# Patient Record
Sex: Male | Born: 1940 | Race: White | Hispanic: No | Marital: Married | Smoking: Never smoker
Health system: Southern US, Community
[De-identification: ages and names within clinical notes are randomized; demographics above are authoritative.]

## PROBLEM LIST (undated history)

## (undated) DIAGNOSIS — I10 Essential (primary) hypertension: Secondary | ICD-10-CM

## (undated) DIAGNOSIS — E7800 Pure hypercholesterolemia, unspecified: Secondary | ICD-10-CM

---

## 2015-03-31 ENCOUNTER — Inpatient Hospital Stay (HOSPITAL_COMMUNITY)
Admission: EM | Admit: 2015-03-31 | Discharge: 2015-04-04 | DRG: 520 | Disposition: A | Discharge status: Home Health | Payer: Medicare Other | Attending: Neurological Surgery | Admitting: Neurological Surgery

## 2015-03-31 ENCOUNTER — Encounter (HOSPITAL_COMMUNITY): Payer: Self-pay | Admitting: Emergency Medicine

## 2015-03-31 DIAGNOSIS — E780 Pure hypercholesterolemia: Secondary | ICD-10-CM | POA: Diagnosis present

## 2015-03-31 DIAGNOSIS — Z7952 Long term (current) use of systemic steroids: Secondary | ICD-10-CM | POA: Diagnosis not present

## 2015-03-31 DIAGNOSIS — M549 Dorsalgia, unspecified: Secondary | ICD-10-CM | POA: Diagnosis present

## 2015-03-31 DIAGNOSIS — M5416 Radiculopathy, lumbar region: Secondary | ICD-10-CM | POA: Diagnosis present

## 2015-03-31 DIAGNOSIS — M5116 Intervertebral disc disorders with radiculopathy, lumbar region: Secondary | ICD-10-CM | POA: Diagnosis present

## 2015-03-31 DIAGNOSIS — M5126 Other intervertebral disc displacement, lumbar region: Secondary | ICD-10-CM

## 2015-03-31 DIAGNOSIS — I10 Essential (primary) hypertension: Secondary | ICD-10-CM | POA: Diagnosis present

## 2015-03-31 DIAGNOSIS — Z791 Long term (current) use of non-steroidal anti-inflammatories (NSAID): Secondary | ICD-10-CM | POA: Diagnosis not present

## 2015-03-31 DIAGNOSIS — Z419 Encounter for procedure for purposes other than remedying health state, unspecified: Secondary | ICD-10-CM

## 2015-03-31 HISTORY — DX: Pure hypercholesterolemia, unspecified: E78.00

## 2015-03-31 HISTORY — DX: Essential (primary) hypertension: I10

## 2015-03-31 MED ORDER — HYDROMORPHONE HCL 1 MG/ML IJ SOLN
1.0000 mg | INTRAMUSCULAR | Status: DC | PRN
  Administered 2015-04-03 – 2015-04-04 (×2): 1 mg via INTRAVENOUS
  Filled 2015-03-31 (×2): qty 1

## 2015-03-31 MED ORDER — FLUTICASONE PROPIONATE 50 MCG/ACT NA SUSP
1.0000 | Freq: Every day | NASAL | Status: DC
  Administered 2015-04-04: 1 via NASAL
  Filled 2015-03-31 (×2): qty 16

## 2015-03-31 MED ORDER — OXYCODONE-ACETAMINOPHEN 5-325 MG PO TABS
1.0000 | ORAL_TABLET | Freq: Once | ORAL | Status: AC
Start: 2015-03-31 — End: 2015-03-31
  Administered 2015-03-31: 1 via ORAL

## 2015-03-31 MED ORDER — HYDROCODONE-ACETAMINOPHEN 5-325 MG PO TABS: 1.0000 | ORAL_TABLET | Freq: Four times a day (QID) | ORAL | Status: DC | PRN

## 2015-03-31 MED ORDER — KETOROLAC TROMETHAMINE 15 MG/ML IJ SOLN
15.0000 mg | Freq: Four times a day (QID) | INTRAMUSCULAR | Status: AC
  Administered 2015-03-31 – 2015-04-01 (×5): 15 mg via INTRAVENOUS
  Filled 2015-03-31 (×5): qty 1

## 2015-03-31 MED ORDER — HYDROXYZINE HCL 25 MG PO TABS: 50.0000 mg | ORAL_TABLET | Freq: Three times a day (TID) | ORAL | Status: DC | PRN

## 2015-03-31 MED ORDER — DEXAMETHASONE SODIUM PHOSPHATE 10 MG/ML IJ SOLN
INTRAMUSCULAR | Status: AC
  Filled 2015-03-31: qty 1

## 2015-03-31 MED ORDER — MAGNESIUM CITRATE PO SOLN: 1.0000 | Freq: Once | ORAL | Status: AC | PRN

## 2015-03-31 MED ORDER — DIAZEPAM 5 MG PO TABS
10.0000 mg | ORAL_TABLET | Freq: Three times a day (TID) | ORAL | Status: DC | PRN
  Administered 2015-04-03 – 2015-04-04 (×2): 10 mg via ORAL
  Filled 2015-03-31 (×2): qty 2

## 2015-03-31 MED ORDER — METOCLOPRAMIDE HCL 10 MG PO TABS
10.0000 mg | ORAL_TABLET | Freq: Three times a day (TID) | ORAL | Status: DC
  Administered 2015-03-31 – 2015-04-04 (×8): 10 mg via ORAL
  Filled 2015-03-31 (×8): qty 1

## 2015-03-31 MED ORDER — PANTOPRAZOLE SODIUM 40 MG PO TBEC
40.0000 mg | DELAYED_RELEASE_TABLET | Freq: Every day | ORAL | Status: DC
  Administered 2015-04-01 – 2015-04-04 (×4): 40 mg via ORAL
  Filled 2015-03-31 (×4): qty 1

## 2015-03-31 MED ORDER — AMLODIPINE BESYLATE 5 MG PO TABS
5.0000 mg | ORAL_TABLET | Freq: Every day | ORAL | Status: DC
Start: 2015-04-01 — End: 2015-04-04
  Administered 2015-04-01 – 2015-04-04 (×4): 5 mg via ORAL
  Filled 2015-03-31 (×4): qty 1

## 2015-03-31 MED ORDER — HYDROCHLOROTHIAZIDE 25 MG PO TABS: 12.5000 mg | ORAL_TABLET | Freq: Every morning | ORAL | Status: DC

## 2015-03-31 MED ORDER — BISACODYL 10 MG RE SUPP: 10.0000 mg | Freq: Every day | RECTAL | Status: DC | PRN

## 2015-03-31 MED ORDER — ALUM & MAG HYDROXIDE-SIMETH 200-200-20 MG/5ML PO SUSP: 30.0000 mL | Freq: Four times a day (QID) | ORAL | Status: DC | PRN

## 2015-03-31 MED ORDER — OXYCODONE-ACETAMINOPHEN 5-325 MG PO TABS
1.0000 | ORAL_TABLET | ORAL | Status: DC | PRN
  Administered 2015-04-01 – 2015-04-03 (×9): 2 via ORAL
  Administered 2015-04-03: 1 via ORAL
  Filled 2015-03-31 (×11): qty 2

## 2015-03-31 MED ORDER — OXYCODONE-ACETAMINOPHEN 5-325 MG PO TABS
1.0000 | ORAL_TABLET | Freq: Once | ORAL | Status: AC
  Administered 2015-03-31: 1 via ORAL
  Filled 2015-03-31: qty 1

## 2015-03-31 MED ORDER — HYDROMORPHONE HCL 1 MG/ML IJ SOLN
1.0000 mg | INTRAMUSCULAR | Status: DC | PRN
  Administered 2015-03-31: 1 mg via INTRAVENOUS
  Filled 2015-03-31: qty 1

## 2015-03-31 MED ORDER — DOCUSATE SODIUM 100 MG PO CAPS
100.0000 mg | ORAL_CAPSULE | Freq: Two times a day (BID) | ORAL | Status: DC
  Administered 2015-03-31 – 2015-04-02 (×5): 100 mg via ORAL
  Filled 2015-03-31 (×5): qty 1

## 2015-03-31 MED ORDER — OXYCODONE-ACETAMINOPHEN 5-325 MG PO TABS
ORAL_TABLET | ORAL | Status: AC
  Administered 2015-03-31: 1 via ORAL
  Filled 2015-03-31: qty 1

## 2015-03-31 MED ORDER — ONDANSETRON HCL 4 MG/2ML IJ SOLN: 4.0000 mg | Freq: Three times a day (TID) | INTRAMUSCULAR | Status: AC | PRN

## 2015-03-31 MED ORDER — DEXAMETHASONE SODIUM PHOSPHATE 10 MG/ML IJ SOLN
10.0000 mg | Freq: Once | INTRAMUSCULAR | Status: AC
  Administered 2015-03-31: 10 mg via INTRAVENOUS

## 2015-03-31 MED ORDER — SENNA 8.6 MG PO TABS
1.0000 | ORAL_TABLET | Freq: Two times a day (BID) | ORAL | Status: DC
  Administered 2015-03-31 – 2015-04-02 (×5): 8.6 mg via ORAL
  Filled 2015-03-31 (×5): qty 1

## 2015-03-31 MED ORDER — HYDROCHLOROTHIAZIDE 12.5 MG PO CAPS
12.5000 mg | ORAL_CAPSULE | Freq: Every morning | ORAL | Status: DC
  Administered 2015-04-01 – 2015-04-04 (×4): 12.5 mg via ORAL
  Filled 2015-03-31 (×4): qty 1

## 2015-03-31 MED ORDER — POLYETHYLENE GLYCOL 3350 17 G PO PACK: 17.0000 g | PACK | Freq: Every day | ORAL | Status: DC | PRN

## 2015-03-31 MED ORDER — DOCUSATE SODIUM 100 MG PO CAPS: 100.0000 mg | ORAL_CAPSULE | Freq: Two times a day (BID) | ORAL | Status: DC

## 2015-03-31 MED ORDER — ENOXAPARIN SODIUM 30 MG/0.3ML ~~LOC~~ SOLN
30.0000 mg | SUBCUTANEOUS | Status: DC
  Administered 2015-04-01 – 2015-04-04 (×4): 30 mg via SUBCUTANEOUS
  Filled 2015-03-31 (×4): qty 0.3

## 2015-03-31 MED ORDER — DEXAMETHASONE SODIUM PHOSPHATE 10 MG/ML IJ SOLN
8.0000 mg | Freq: Four times a day (QID) | INTRAMUSCULAR | Status: DC
  Administered 2015-03-31 – 2015-04-02 (×6): 8 mg via INTRAVENOUS
  Filled 2015-03-31 (×6): qty 1

## 2015-03-31 MED ORDER — METOPROLOL SUCCINATE ER 25 MG PO TB24
50.0000 mg | ORAL_TABLET | Freq: Every morning | ORAL | Status: DC
  Administered 2015-04-02 – 2015-04-03 (×2): 50 mg via ORAL
  Filled 2015-03-31 (×2): qty 2

## 2015-03-31 NOTE — ED Provider Notes (Signed)
CSN: [id number]     Arrival date & time 03/31/15  1515 History   First MD Initiated Contact with Patient 03/31/15 1748     Chief Complaint  Patient presents with  . Back Pain  . Leg Pain     (Consider location/radiation/quality/duration/timing/severity/associated sxs/prior Treatment) HPI Complains of right-sided paralumbar pain onset one week ago radiating to his right thigh. Pain is worse with changing positions improved with remaining still. No other associated symptoms. Patient seen at [facility] last week. Had CT scan of the lumbar spine consistent with L4-L5 disc protrusion and degenerative disc disease. He denies loss of bladder or bowel control no fever no other associated symptoms. Treated with Medrol Dosepak, Percocet, Valium, without adequate pain relief. No other associated symptoms Past Medical History  Diagnosis Date  . Hypertension   . Hypercholesteremia    History reviewed. No pertinent past surgical history. History reviewed. No pertinent family history. History  Substance Use Topics  . Smoking status: Never Smoker   . Smokeless tobacco: Not on file  . Alcohol Use: No    Review of Systems  Constitutional: Negative.   HENT: Negative.   Respiratory: Negative.   Cardiovascular: Negative.   Gastrointestinal: Negative.   Musculoskeletal: Positive for back pain.  Skin: Negative.   Neurological: Negative.   Psychiatric/Behavioral: Negative.   All other systems reviewed and are negative.     Allergies  Crestor; Lipitor; Lopid; Niaspan; Other; Pravachol; Questran; Zetia; and Zocor  Home Medications   Prior to Admission medications   Medication Sig Start Date End Date Taking? Authorizing Provider  amLODipine (NORVASC) 5 MG tablet Take 5 mg by mouth daily. 01/14/15  Yes Historical Provider, MD  diazepam (VALIUM) 10 MG tablet Take 10 mg by mouth 2 (two) times daily as needed for anxiety.  03/27/15  Yes Historical Provider, MD  docusate sodium (COLACE) 100 MG  capsule Take 100 mg by mouth 2 (two) times daily.   Yes Historical Provider, MD  fenofibrate 160 MG tablet Take 160 mg by mouth daily. 01/14/15  Yes Historical Provider, MD  fluticasone (FLONASE) 50 MCG/ACT nasal spray Place 1-2 sprays into both nostrils daily. 02/04/15  Yes Historical Provider, MD  hydrochlorothiazide (HYDRODIURIL) 12.5 MG tablet Take 12.5 mg by mouth every morning. 02/04/15  Yes Historical Provider, MD  HYDROcodone-acetaminophen (NORCO/VICODIN) 5-325 MG per tablet Take 1 tablet by mouth every 6 (six) hours as needed. for pain 03/26/15  Yes Historical Provider, MD  hydroxychloroquine (PLAQUENIL) 200 MG tablet Take 200 mg by mouth 2 (two) times daily. 03/02/15  Yes Historical Provider, MD  hydrOXYzine (VISTARIL) 50 MG capsule Take 50 mg by mouth 3 (three) times daily as needed for itching.  03/27/15  Yes Historical Provider, MD  ibuprofen (ADVIL,MOTRIN) 600 MG tablet Take 600 mg by mouth every 6 (six) hours as needed for moderate pain.  03/26/15  Yes Historical Provider, MD  meloxicam (MOBIC) 7.5 MG tablet Take 7.5 mg by mouth daily. 03/20/15  Yes Historical Provider, MD  methylPREDNISolone (MEDROL DOSEPAK) 4 MG TBPK tablet Take 4 mg by mouth See admin instructions. 03/27/15  Yes Historical Provider, MD  metoCLOPramide (REGLAN) 10 MG tablet Take 10 mg by mouth 4 (four) times daily -  before meals and at bedtime. 03/27/15  Yes Historical Provider, MD  metoprolol succinate (TOPROL-XL) 50 MG 24 hr tablet Take 50 mg by mouth every morning. 02/09/15  Yes Historical Provider, MD  omeprazole (PRILOSEC) 20 MG capsule Take 20 mg by mouth 2 (two) times daily. 02/16/15  Yes Historical Provider, MD  oxyCODONE-acetaminophen (PERCOCET/ROXICET) 5-325 MG per tablet Take 1-2 tablets by mouth every 4 (four) hours as needed for moderate pain or severe pain.  03/27/15  Yes Historical Provider, MD  predniSONE (DELTASONE) 5 MG tablet Take 5 mg by mouth daily. 03/20/15  Yes Historical Provider, MD  tiZANidine (ZANAFLEX) 4  MG tablet Take 4 mg by mouth 3 (three) times daily. 03/24/15  Yes Historical Provider, MD  traMADol (ULTRAM) 50 MG tablet Take 50 mg by mouth every 4 (four) hours as needed for moderate pain.  02/09/15  Yes Historical Provider, MD  WELCHOL 625 MG tablet Take 1,875 mg by mouth 2 (two) times daily. 03/18/15  Yes Historical Provider, MD   BP 168/77 mmHg  Pulse 62  Temp(Src) 97.6 F (36.4 C) (Oral)  Resp 18  SpO2 96% Physical Exam  Constitutional: He appears well-developed and well-nourished. No distress.  HENT:  Head: Normocephalic and atraumatic.  Eyes: Conjunctivae are normal. Pupils are equal, round, and reactive to light.  Neck: Neck supple. No tracheal deviation present. No thyromegaly present.  Cardiovascular: Normal rate and regular rhythm.   No murmur heard. Pulmonary/Chest: Effort normal and breath sounds normal.  Abdominal: Soft. Bowel sounds are normal. He exhibits no distension. There is no tenderness.  Obese  Musculoskeletal: Normal range of motion. He exhibits no edema or tenderness.  Entire spine nontender. Has right-sided paralumbar pain when standing from a seated position. Gait normal DTR symmetric bilaterally knee jerk ankle jerk and biceps toes downward going bilaterally  Neurological: He is alert. Coordination normal.  Skin: Skin is warm and dry. No rash noted.  Psychiatric: He has a normal mood and affect.  Nursing note and vitals reviewed.   ED Course  Procedures (including critical care time) Labs Review Labs Reviewed - No data to display  Imaging Review No results found.   EKG Interpretation None      MDM  With Dr. Danielle Dess who reviewed CT scans and arrange for admission for pain control and surgery Final diagnoses:  None   diagnosis lumbar radiculopathy      Doug Sou, MD 03/31/15 1935

## 2015-03-31 NOTE — ED Notes (Signed)
Pt c/o right leg and lower back pain; pt seen in ED x 2 in last week for same and told has bulging disc but not getting relief with PO meds

## 2015-03-31 NOTE — H&P (Addendum)
Dean Andersen is an 74 y.o. male.   Chief Complaint: Severe right lower extremity pain for over a week's time HPI: Patient is a 74 year old individual who's had severe back and right lower extremity pain that started rather insidiously a little over week ago. He is seen in the [facility] emergency room where CT scan of his back was performed. He was subsequently sent home and given some pain medication. He presented to the [facility] emergency room in severe pain. I was asked to review the CAT scan but because the patient was not responding to high doses of narcotic analgesia was decided to admit the patient in preparation for surgical intervention which would include a discectomy at the level of L4-L5 to remove a large right-sided fragment of disc herniation the age and is compressing the L4 nerve root.  Past Medical History  Diagnosis Date  . Hypertension   . Hypercholesteremia     History reviewed. No pertinent past surgical history.  History reviewed. No pertinent family history. Social History:  reports that he has never smoked. He does not have any smokeless tobacco history on file. He reports that he does not drink alcohol or use illicit drugs.  Allergies:  Allergies  Allergen Reactions  . Crestor [Rosuvastatin Calcium] Other (See Comments)    Leg ache  . Lipitor [Atorvastatin] Other (See Comments)    myopathy  . Lopid [Gemfibrozil] Other (See Comments)    Muscle soreness  . Niaspan [Niacin Er] Other (See Comments)    flush  . Other Other (See Comments)    Angiotensin receptor blockers: increase creatinine   . Pravachol [Pravastatin Sodium] Other (See Comments)    unknown  . Questran [Cholestyramine] Other (See Comments)    indigestion  . Zetia [Ezetimibe] Other (See Comments)    Muscle pain  . Zocor [Simvastatin] Other (See Comments)    myopathy    Medications Prior to Admission  Medication Sig Dispense Refill  . amLODipine (NORVASC) 5 MG tablet Take 5 mg by  mouth daily.  0  . diazepam (VALIUM) 10 MG tablet Take 10 mg by mouth 2 (two) times daily as needed for anxiety.   0  . docusate sodium (COLACE) 100 MG capsule Take 100 mg by mouth 2 (two) times daily.    . fenofibrate 160 MG tablet Take 160 mg by mouth daily.  0  . fluticasone (FLONASE) 50 MCG/ACT nasal spray Place 1-2 sprays into both nostrils daily.  3  . hydrochlorothiazide (HYDRODIURIL) 12.5 MG tablet Take 12.5 mg by mouth every morning.  4  . HYDROcodone-acetaminophen (NORCO/VICODIN) 5-325 MG per tablet Take 1 tablet by mouth every 6 (six) hours as needed. for pain  0  . hydroxychloroquine (PLAQUENIL) 200 MG tablet Take 200 mg by mouth 2 (two) times daily.  3  . hydrOXYzine (VISTARIL) 50 MG capsule Take 50 mg by mouth 3 (three) times daily as needed for itching.   0  . ibuprofen (ADVIL,MOTRIN) 600 MG tablet Take 600 mg by mouth every 6 (six) hours as needed for moderate pain.   0  . meloxicam (MOBIC) 7.5 MG tablet Take 7.5 mg by mouth daily.  1  . methylPREDNISolone (MEDROL DOSEPAK) 4 MG TBPK tablet Take 4 mg by mouth See admin instructions.  0  . metoCLOPramide (REGLAN) 10 MG tablet Take 10 mg by mouth 4 (four) times daily -  before meals and at bedtime.  0  . metoprolol succinate (TOPROL-XL) 50 MG 24 hr tablet Take 50 mg by  mouth every morning.  3  . omeprazole (PRILOSEC) 20 MG capsule Take 20 mg by mouth 2 (two) times daily.  3  . oxyCODONE-acetaminophen (PERCOCET/ROXICET) 5-325 MG per tablet Take 1-2 tablets by mouth every 4 (four) hours as needed for moderate pain or severe pain.   0  . predniSONE (DELTASONE) 5 MG tablet Take 5 mg by mouth daily.  3  . tiZANidine (ZANAFLEX) 4 MG tablet Take 4 mg by mouth 3 (three) times daily.  99  . traMADol (ULTRAM) 50 MG tablet Take 50 mg by mouth every 4 (four) hours as needed for moderate pain.   0  . WELCHOL 625 MG tablet Take 1,875 mg by mouth 2 (two) times daily.  3    No results found for this or any previous visit (from the past 48  hour(s)). No results found.  Review of Systems  Constitutional: Positive for malaise/fatigue.  HENT: Negative.   Eyes: Negative.   Respiratory: Negative.   Cardiovascular:       Hypertension and high cholesterol allergic to most cholesterol-lowering ring agents also a lot of allergic to angiogram tense and receptor blockers as these have elevated his creatinine  Gastrointestinal: Negative.   Genitourinary: Negative.   Musculoskeletal: Positive for back pain.  Skin: Negative.   Neurological: Positive for weakness.       Right lower extremity pain and weakness in the iliopsoas and the quad  Endo/Heme/Allergies: Negative.   Psychiatric/Behavioral: Negative.     Blood pressure 181/67, pulse 56, temperature 97.4 F (36.3 C), temperature source Axillary, resp. rate 18, SpO2 96 %. Physical Exam  Constitutional: He appears well-developed and well-nourished.  HENT:  Head: Normocephalic and atraumatic.  Eyes: Conjunctivae and EOM are normal. Pupils are equal, round, and reactive to light.  Neck: Normal range of motion. Neck supple.  Cardiovascular: Normal rate and regular rhythm.   Respiratory: Effort normal and breath sounds normal.  GI: Soft. Bowel sounds are normal.  Musculoskeletal:  Weakness in the quadriceps on the right side to 4 minus out of 5 absent Achilles reflex bilaterally absent patellar reflex on the right 2+ patellar reflex on the left cranial nerve examination is normal motor strength in upper extremities is normal patient has severe antalgia on the right leg when he walks with a knee having a tendency to buckle.  Neurological:  Marked paravertebral spasm in the lower back  Skin: Skin is warm and dry.  Psychiatric: He has a normal mood and affect. His behavior is normal. Judgment and thought content normal.     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  Parenteral pain medication control high-dose Decadron.  Dean Andersen J 03/31/2015, 10:10 PM

## 2015-03-31 NOTE — Progress Notes (Signed)
Pt arrived to room 4N17 from ED. Pt is alert and oriented and stating pain 10/10.  Safety measures in place.  Wife is at bedside.  RN paged on-call neurosurgereon and is awaiting orders.  Will continue to monitor.  Estanislado Emms, RN

## 2015-04-01 ENCOUNTER — Other Ambulatory Visit: Payer: Self-pay | Admitting: Neurological Surgery

## 2015-04-01 LAB — COMPREHENSIVE METABOLIC PANEL
ALBUMIN: 3.8 g/dL (ref 3.5–5.0)
ALT: 33 U/L (ref 17–63)
AST: 29 U/L (ref 15–41)
Alkaline Phosphatase: 35 U/L — ABNORMAL LOW (ref 38–126)
Anion gap: 10 (ref 5–15)
BUN: 20 mg/dL (ref 6–20)
CALCIUM: 9.5 mg/dL (ref 8.9–10.3)
CO2: 27 mmol/L (ref 22–32)
CREATININE: 1.35 mg/dL — AB (ref 0.61–1.24)
Chloride: 102 mmol/L (ref 101–111)
GFR calc Af Amer: 59 mL/min — ABNORMAL LOW (ref >60)
GFR calc non Af Amer: 51 mL/min — ABNORMAL LOW (ref >60)
Glucose, Bld: 141 mg/dL — ABNORMAL HIGH (ref 65–99)
Potassium: 4.2 mmol/L (ref 3.5–5.1)
Sodium: 139 mmol/L (ref 135–145)
Total Bilirubin: 0.7 mg/dL (ref 0.3–1.2)
Total Protein: 6 g/dL — ABNORMAL LOW (ref 6.5–8.1)

## 2015-04-01 LAB — CBC
HEMATOCRIT: 38.6 % — AB (ref 39.0–52.0)
HEMATOCRIT: 40.7 % (ref 39.0–52.0)
HEMOGLOBIN: 13.8 g/dL (ref 13.0–17.0)
Hemoglobin: 13 g/dL (ref 13.0–17.0)
MCH: 30.3 pg (ref 26.0–34.0)
MCH: 30.8 pg (ref 26.0–34.0)
MCHC: 33.7 g/dL (ref 30.0–36.0)
MCHC: 33.9 g/dL (ref 30.0–36.0)
MCV: 89.3 fL (ref 78.0–100.0)
MCV: 91.5 fL (ref 78.0–100.0)
Platelets: 178 10*3/uL (ref 150–400)
Platelets: 215 10*3/uL (ref 150–400)
RBC: 4.22 MIL/uL (ref 4.22–5.81)
RBC: 4.56 MIL/uL (ref 4.22–5.81)
RDW: 12.7 % (ref 11.5–15.5)
RDW: 12.8 % (ref 11.5–15.5)
WBC: 8.5 10*3/uL (ref 4.0–10.5)
WBC: 8.8 10*3/uL (ref 4.0–10.5)

## 2015-04-01 LAB — CREATININE, SERUM
Creatinine, Ser: 1.37 mg/dL — ABNORMAL HIGH (ref 0.61–1.24)
GFR calc Af Amer: 58 mL/min — ABNORMAL LOW (ref >60)
GFR, EST NON AFRICAN AMERICAN: 50 mL/min — AB (ref >60)

## 2015-04-01 LAB — GLUCOSE, CAPILLARY
GLUCOSE-CAPILLARY: 171 mg/dL — AB (ref 65–99)
Glucose-Capillary: 170 mg/dL — ABNORMAL HIGH (ref 65–99)
Glucose-Capillary: 139 mg/dL — ABNORMAL HIGH (ref 65–99)

## 2015-04-02 ENCOUNTER — Inpatient Hospital Stay (HOSPITAL_COMMUNITY): Payer: Medicare Other

## 2015-04-02 LAB — GLUCOSE, CAPILLARY
GLUCOSE-CAPILLARY: 144 mg/dL — AB (ref 65–99)
Glucose-Capillary: 173 mg/dL — ABNORMAL HIGH (ref 65–99)
Glucose-Capillary: 157 mg/dL — ABNORMAL HIGH (ref 65–99)
Glucose-Capillary: 139 mg/dL — ABNORMAL HIGH (ref 65–99)

## 2015-04-02 MED ORDER — DEXAMETHASONE 4 MG PO TABS
4.0000 mg | ORAL_TABLET | Freq: Three times a day (TID) | ORAL | Status: DC
  Administered 2015-04-02 – 2015-04-03 (×5): 4 mg via ORAL
  Filled 2015-04-02 (×5): qty 1

## 2015-04-02 NOTE — Progress Notes (Signed)
Patient ID: Dean Andersen, male   DOB: 01-20-1941, 74 y.o.   MRN: [id number] Vital signs are stable Patient feels a bit better He is sitting up more comfortably Right leg pain still there Prior to proceeding with surgery tomorrow like to obtain a MRI to verify his pathology Will order that today

## 2015-04-03 ENCOUNTER — Encounter (HOSPITAL_COMMUNITY)
Admission: EM | Disposition: A | Discharge status: Home Health | Payer: Self-pay | Source: Home / Self Care | Attending: Neurological Surgery

## 2015-04-03 ENCOUNTER — Inpatient Hospital Stay (HOSPITAL_COMMUNITY): Payer: Medicare Other

## 2015-04-03 ENCOUNTER — Encounter (HOSPITAL_COMMUNITY): Payer: Self-pay | Admitting: Anesthesiology

## 2015-04-03 ENCOUNTER — Inpatient Hospital Stay (HOSPITAL_COMMUNITY): Payer: Medicare Other | Admitting: Anesthesiology

## 2015-04-03 HISTORY — PX: LUMBAR LAMINECTOMY/DECOMPRESSION MICRODISCECTOMY: SHX5026

## 2015-04-03 LAB — GLUCOSE, CAPILLARY
GLUCOSE-CAPILLARY: 122 mg/dL — AB (ref 65–99)
Glucose-Capillary: 129 mg/dL — ABNORMAL HIGH (ref 65–99)

## 2015-04-03 LAB — SURGICAL PCR SCREEN
MRSA, PCR: NEGATIVE
Staphylococcus aureus: NEGATIVE

## 2015-04-03 SURGERY — LUMBAR LAMINECTOMY/DECOMPRESSION MICRODISCECTOMY 1 LEVEL
Anesthesia: General | Site: Back | Laterality: Right

## 2015-04-03 MED ORDER — DEXAMETHASONE SODIUM PHOSPHATE 4 MG/ML IJ SOLN
INTRAMUSCULAR | Status: DC | PRN
  Administered 2015-04-03: 4 mg via INTRAVENOUS

## 2015-04-03 MED ORDER — SODIUM CHLORIDE 0.9 % IJ SOLN
INTRAMUSCULAR | Status: AC
  Filled 2015-04-03: qty 10

## 2015-04-03 MED ORDER — PROMETHAZINE HCL 25 MG/ML IJ SOLN: 6.2500 mg | INTRAMUSCULAR | Status: DC | PRN

## 2015-04-03 MED ORDER — OXYCODONE-ACETAMINOPHEN 5-325 MG PO TABS
1.0000 | ORAL_TABLET | ORAL | Status: DC | PRN
  Administered 2015-04-03 – 2015-04-04 (×5): 2 via ORAL
  Filled 2015-04-03 (×5): qty 2

## 2015-04-03 MED ORDER — HYDROMORPHONE HCL 1 MG/ML IJ SOLN: 0.2500 mg | INTRAMUSCULAR | Status: DC | PRN

## 2015-04-03 MED ORDER — HYDROCODONE-ACETAMINOPHEN 5-325 MG PO TABS: 1.0000 | ORAL_TABLET | ORAL | Status: DC | PRN

## 2015-04-03 MED ORDER — POLYETHYLENE GLYCOL 3350 17 G PO PACK: 17.0000 g | PACK | Freq: Every day | ORAL | Status: DC | PRN

## 2015-04-03 MED ORDER — PHENYLEPHRINE 40 MCG/ML (10ML) SYRINGE FOR IV PUSH (FOR BLOOD PRESSURE SUPPORT)
PREFILLED_SYRINGE | INTRAVENOUS | Status: AC
  Filled 2015-04-03: qty 10

## 2015-04-03 MED ORDER — ONDANSETRON HCL 4 MG/2ML IJ SOLN
INTRAMUSCULAR | Status: DC | PRN
Start: 2015-04-03 — End: 2015-04-03
  Administered 2015-04-03: 4 mg via INTRAVENOUS

## 2015-04-03 MED ORDER — LIDOCAINE-EPINEPHRINE 1 %-1:100000 IJ SOLN
INTRAMUSCULAR | Status: DC | PRN
  Administered 2015-04-03: 4.5 mL

## 2015-04-03 MED ORDER — CEFAZOLIN SODIUM-DEXTROSE 2-3 GM-% IV SOLR
INTRAVENOUS | Status: AC
  Filled 2015-04-03: qty 50

## 2015-04-03 MED ORDER — LIDOCAINE HCL (CARDIAC) 20 MG/ML IV SOLN
INTRAVENOUS | Status: AC
  Filled 2015-04-03: qty 5

## 2015-04-03 MED ORDER — HYDROMORPHONE HCL 1 MG/ML IJ SOLN: 0.5000 mg | INTRAMUSCULAR | Status: DC | PRN

## 2015-04-03 MED ORDER — LIDOCAINE HCL (CARDIAC) 20 MG/ML IV SOLN
INTRAVENOUS | Status: DC | PRN
  Administered 2015-04-03: 60 mg via INTRAVENOUS

## 2015-04-03 MED ORDER — SENNA 8.6 MG PO TABS
1.0000 | ORAL_TABLET | Freq: Two times a day (BID) | ORAL | Status: DC
  Administered 2015-04-03 – 2015-04-04 (×2): 8.6 mg via ORAL
  Filled 2015-04-03 (×2): qty 1

## 2015-04-03 MED ORDER — SODIUM CHLORIDE 0.9 % IV SOLN: 250.0000 mL | INTRAVENOUS | Status: DC

## 2015-04-03 MED ORDER — ALUM & MAG HYDROXIDE-SIMETH 200-200-20 MG/5ML PO SUSP: 30.0000 mL | Freq: Four times a day (QID) | ORAL | Status: DC | PRN

## 2015-04-03 MED ORDER — 0.9 % SODIUM CHLORIDE (POUR BTL) OPTIME
TOPICAL | Status: DC | PRN
  Administered 2015-04-03: 1000 mL

## 2015-04-03 MED ORDER — FENTANYL CITRATE (PF) 100 MCG/2ML IJ SOLN
INTRAMUSCULAR | Status: DC | PRN
  Administered 2015-04-03 (×5): 50 ug via INTRAVENOUS

## 2015-04-03 MED ORDER — FLEET ENEMA 7-19 GM/118ML RE ENEM: 1.0000 | ENEMA | Freq: Once | RECTAL | Status: AC | PRN

## 2015-04-03 MED ORDER — SODIUM CHLORIDE 0.9 % IJ SOLN: 3.0000 mL | Freq: Two times a day (BID) | INTRAMUSCULAR | Status: DC

## 2015-04-03 MED ORDER — BISACODYL 10 MG RE SUPP: 10.0000 mg | Freq: Every day | RECTAL | Status: DC | PRN

## 2015-04-03 MED ORDER — LACTATED RINGERS IV SOLN
INTRAVENOUS | Status: DC | PRN
  Administered 2015-04-03: 15:00:00 via INTRAVENOUS

## 2015-04-03 MED ORDER — SODIUM CHLORIDE 0.9 % IJ SOLN: 3.0000 mL | INTRAMUSCULAR | Status: DC | PRN

## 2015-04-03 MED ORDER — PROPOFOL 10 MG/ML IV BOLUS
INTRAVENOUS | Status: AC
  Filled 2015-04-03: qty 20

## 2015-04-03 MED ORDER — PROPOFOL 10 MG/ML IV BOLUS
INTRAVENOUS | Status: DC | PRN
  Administered 2015-04-03: 150 mg via INTRAVENOUS

## 2015-04-03 MED ORDER — METHOCARBAMOL 500 MG PO TABS: 500.0000 mg | ORAL_TABLET | Freq: Four times a day (QID) | ORAL | Status: DC | PRN

## 2015-04-03 MED ORDER — THROMBIN 5000 UNITS EX SOLR
CUTANEOUS | Status: DC | PRN
  Administered 2015-04-03 (×2): 5000 [IU] via TOPICAL

## 2015-04-03 MED ORDER — METHOCARBAMOL 1000 MG/10ML IJ SOLN
500.0000 mg | Freq: Four times a day (QID) | INTRAVENOUS | Status: DC | PRN
  Filled 2015-04-03: qty 5

## 2015-04-03 MED ORDER — SODIUM CHLORIDE 0.9 % IR SOLN
Status: DC | PRN
  Administered 2015-04-03: 15:00:00

## 2015-04-03 MED ORDER — BUPIVACAINE HCL (PF) 0.5 % IJ SOLN
INTRAMUSCULAR | Status: DC | PRN
  Administered 2015-04-03: 4.5 mL
  Administered 2015-04-03: 20 mL

## 2015-04-03 MED ORDER — ARTIFICIAL TEARS OP OINT
TOPICAL_OINTMENT | OPHTHALMIC | Status: DC | PRN
  Administered 2015-04-03: 1 via OPHTHALMIC

## 2015-04-03 MED ORDER — DEXAMETHASONE 2 MG PO TABS
2.0000 mg | ORAL_TABLET | Freq: Two times a day (BID) | ORAL | Status: DC
  Administered 2015-04-03 – 2015-04-04 (×2): 2 mg via ORAL
  Filled 2015-04-03 (×2): qty 1

## 2015-04-03 MED ORDER — FENTANYL CITRATE (PF) 250 MCG/5ML IJ SOLN
INTRAMUSCULAR | Status: AC
  Filled 2015-04-03: qty 5

## 2015-04-03 MED ORDER — PHENYLEPHRINE HCL 10 MG/ML IJ SOLN
INTRAMUSCULAR | Status: DC | PRN
  Administered 2015-04-03 (×3): 80 ug via INTRAVENOUS

## 2015-04-03 MED ORDER — ONDANSETRON HCL 4 MG/2ML IJ SOLN
INTRAMUSCULAR | Status: AC
  Filled 2015-04-03: qty 2

## 2015-04-03 MED ORDER — DEXAMETHASONE SODIUM PHOSPHATE 4 MG/ML IJ SOLN
INTRAMUSCULAR | Status: AC
  Filled 2015-04-03: qty 1

## 2015-04-03 MED ORDER — HYDROCODONE-ACETAMINOPHEN 5-325 MG PO TABS
1.0000 | ORAL_TABLET | ORAL | Status: DC | PRN
  Filled 2015-04-03: qty 1

## 2015-04-03 MED ORDER — ACETAMINOPHEN 325 MG PO TABS: 650.0000 mg | ORAL_TABLET | ORAL | Status: DC | PRN

## 2015-04-03 MED ORDER — HEMOSTATIC AGENTS (NO CHARGE) OPTIME
TOPICAL | Status: DC | PRN
  Administered 2015-04-03: 1 via TOPICAL

## 2015-04-03 MED ORDER — MENTHOL 3 MG MT LOZG: 1.0000 | LOZENGE | OROMUCOSAL | Status: DC | PRN

## 2015-04-03 MED ORDER — SUCCINYLCHOLINE CHLORIDE 20 MG/ML IJ SOLN
INTRAMUSCULAR | Status: AC
  Filled 2015-04-03: qty 1

## 2015-04-03 MED ORDER — ROCURONIUM BROMIDE 100 MG/10ML IV SOLN
INTRAVENOUS | Status: DC | PRN
  Administered 2015-04-03: 40 mg via INTRAVENOUS

## 2015-04-03 MED ORDER — EPHEDRINE SULFATE 50 MG/ML IJ SOLN
INTRAMUSCULAR | Status: AC
  Filled 2015-04-03: qty 1

## 2015-04-03 MED ORDER — ONDANSETRON HCL 4 MG/2ML IJ SOLN: 4.0000 mg | INTRAMUSCULAR | Status: DC | PRN

## 2015-04-03 MED ORDER — GLYCOPYRROLATE 0.2 MG/ML IJ SOLN
INTRAMUSCULAR | Status: DC | PRN
  Administered 2015-04-03: 0.6 mg via INTRAVENOUS

## 2015-04-03 MED ORDER — PHENOL 1.4 % MT LIQD: 1.0000 | OROMUCOSAL | Status: DC | PRN

## 2015-04-03 MED ORDER — KETOROLAC TROMETHAMINE 15 MG/ML IJ SOLN
15.0000 mg | Freq: Four times a day (QID) | INTRAMUSCULAR | Status: DC
  Administered 2015-04-04 (×3): 15 mg via INTRAVENOUS
  Filled 2015-04-03 (×3): qty 1

## 2015-04-03 MED ORDER — ACETAMINOPHEN 650 MG RE SUPP
650.0000 mg | RECTAL | Status: DC | PRN
Start: 2015-04-03 — End: 2015-04-04

## 2015-04-03 MED ORDER — DOCUSATE SODIUM 100 MG PO CAPS
100.0000 mg | ORAL_CAPSULE | Freq: Two times a day (BID) | ORAL | Status: DC
  Administered 2015-04-03 – 2015-04-04 (×2): 100 mg via ORAL
  Filled 2015-04-03 (×2): qty 1

## 2015-04-03 MED ORDER — CEFAZOLIN SODIUM-DEXTROSE 2-3 GM-% IV SOLR
INTRAVENOUS | Status: DC | PRN
  Administered 2015-04-03: 2 g via INTRAVENOUS

## 2015-04-03 MED ORDER — NEOSTIGMINE METHYLSULFATE 10 MG/10ML IV SOLN
INTRAVENOUS | Status: DC | PRN
  Administered 2015-04-03: 4 mg via INTRAVENOUS

## 2015-04-03 SURGICAL SUPPLY — 51 items
BAG DECANTER FOR FLEXI CONT (MISCELLANEOUS) ×3 IMPLANT
BLADE CLIPPER SURG (BLADE) IMPLANT
BUR ACORN 6.0 (BURR) IMPLANT
BUR ACORN 6.0MM (BURR)
BUR MATCHSTICK NEURO 3.0 LAGG (BURR) ×3 IMPLANT
CANISTER SUCT 3000ML PPV (MISCELLANEOUS) ×3 IMPLANT
CONT SPEC 4OZ CLIKSEAL STRL BL (MISCELLANEOUS) ×3 IMPLANT
DECANTER SPIKE VIAL GLASS SM (MISCELLANEOUS) ×3 IMPLANT
DERMABOND ADHESIVE PROPEN (GAUZE/BANDAGES/DRESSINGS) ×2
DERMABOND ADVANCED (GAUZE/BANDAGES/DRESSINGS) ×2
DERMABOND ADVANCED .7 DNX12 (GAUZE/BANDAGES/DRESSINGS) ×1 IMPLANT
DERMABOND ADVANCED .7 DNX6 (GAUZE/BANDAGES/DRESSINGS) ×1 IMPLANT
DRAPE LAPAROTOMY T 102X78X121 (DRAPES) ×3 IMPLANT
DRAPE MICROSCOPE LEICA (MISCELLANEOUS) IMPLANT
DRAPE POUCH INSTRU U-SHP 10X18 (DRAPES) ×3 IMPLANT
DRAPE PROXIMA HALF (DRAPES) IMPLANT
DURAPREP 26ML APPLICATOR (WOUND CARE) ×3 IMPLANT
ELECT REM PT RETURN 9FT ADLT (ELECTROSURGICAL) ×3
ELECTRODE REM PT RTRN 9FT ADLT (ELECTROSURGICAL) ×1 IMPLANT
GAUZE SPONGE 4X4 12PLY STRL (GAUZE/BANDAGES/DRESSINGS) ×3 IMPLANT
GAUZE SPONGE 4X4 16PLY XRAY LF (GAUZE/BANDAGES/DRESSINGS) IMPLANT
GLOVE BIOGEL PI IND STRL 8.5 (GLOVE) ×1 IMPLANT
GLOVE BIOGEL PI INDICATOR 8.5 (GLOVE) ×2
GLOVE ECLIPSE 8.5 STRL (GLOVE) ×3 IMPLANT
GLOVE EXAM NITRILE LRG STRL (GLOVE) IMPLANT
GLOVE EXAM NITRILE MD LF STRL (GLOVE) IMPLANT
GLOVE EXAM NITRILE XL STR (GLOVE) IMPLANT
GLOVE EXAM NITRILE XS STR PU (GLOVE) IMPLANT
GOWN STRL REUS W/ TWL LRG LVL3 (GOWN DISPOSABLE) ×2 IMPLANT
GOWN STRL REUS W/ TWL XL LVL3 (GOWN DISPOSABLE) IMPLANT
GOWN STRL REUS W/TWL 2XL LVL3 (GOWN DISPOSABLE) ×3 IMPLANT
GOWN STRL REUS W/TWL LRG LVL3 (GOWN DISPOSABLE) ×4
GOWN STRL REUS W/TWL XL LVL3 (GOWN DISPOSABLE)
KIT BASIN OR (CUSTOM PROCEDURE TRAY) ×3 IMPLANT
KIT ROOM TURNOVER OR (KITS) ×3 IMPLANT
NEEDLE HYPO 22GX1.5 SAFETY (NEEDLE) ×3 IMPLANT
NEEDLE SPNL 20GX3.5 QUINCKE YW (NEEDLE) IMPLANT
NS IRRIG 1000ML POUR BTL (IV SOLUTION) ×3 IMPLANT
PACK LAMINECTOMY NEURO (CUSTOM PROCEDURE TRAY) ×3 IMPLANT
PAD ARMBOARD 7.5X6 YLW CONV (MISCELLANEOUS) ×9 IMPLANT
PATTIES SURGICAL .5 X1 (DISPOSABLE) ×3 IMPLANT
RUBBERBAND STERILE (MISCELLANEOUS) IMPLANT
SPONGE SURGIFOAM ABS GEL SZ50 (HEMOSTASIS) ×3 IMPLANT
SUT VIC AB 1 CT1 18XBRD ANBCTR (SUTURE) ×1 IMPLANT
SUT VIC AB 1 CT1 8-18 (SUTURE) ×2
SUT VIC AB 2-0 CP2 18 (SUTURE) ×3 IMPLANT
SUT VIC AB 3-0 SH 8-18 (SUTURE) ×3 IMPLANT
SYR 20ML ECCENTRIC (SYRINGE) ×3 IMPLANT
TOWEL OR 17X24 6PK STRL BLUE (TOWEL DISPOSABLE) ×3 IMPLANT
TOWEL OR 17X26 10 PK STRL BLUE (TOWEL DISPOSABLE) ×3 IMPLANT
WATER STERILE IRR 1000ML POUR (IV SOLUTION) ×3 IMPLANT

## 2015-04-03 NOTE — Op Note (Signed)
Date of surgery: 04/03/2015 Preoperative diagnosis: Herniated nucleus pulposus L4-5 right with right lumbar radiculopathy Postoperative diagnosis: Herniated nucleus pulposus L4-5 right with right lumbar radiculopathy Procedure: Microdiscectomy L4-5 right with operating microscope my dissection technique Surgeon: Barnett Abu First assistant: Maeola Harman M.D. Anesthesia: Gen. endotracheal Indications: The patient is a 74 year old individual who's had significant back and lower extremity pain he has evidence of a large extruded fragment of disc at L4-L5 on right side. Large fragment of the disc is behind the vertebral body of L4. Been advised regarding surgical decompression of this process. He was admitted to the hospital 2 days ago because of poor ability to control his pain. He has moderate weakness in his tibialis anterior group on the right side. Quadriceps strength is also weak on the right compared to the left.  Procedure: The patient was brought to the operating room supine on a stretcher. After the smooth induction of general endotracheal anesthesia, he was turned prone area the back was prepped with alcohol DuraPrep and draped in a sterile fashion. A midline incision was created in the lower lumbar spine and carried down to the lumbar dorsal fascia. 2 localizing radiographs identified the L4-L5 interspace positively. Subperiosteal dissection was carried out to expose the interspace at L4-L5. Then on the superior margin of the lamina of L4 on the right side a laminotomy was created. The common dural tube was identified. The dissection was carried out laterally. The lateral aspect of the dura was then carefully dissected under the operating microscope using high-power magnification. As the ventral aspect was exposed was noted to be significant disc material loose in the space. This was removed in a piecemeal fashion. Numerous fragments were removed from this region until the space was cleared. The  fragments range in size from just 3-4 mm to as large as 15 mm in diameter. In the end the good decompression of the common dural tube and the L4 nerve root was obtained. Hemostasis was obtained with bipolar cautery and some small pledgets of Gelfoam soaked in thrombin some of which were left in the interspace. The disc space itself could not be open as a discectomy itself was carried out mostly superior to the disc space. The path beyond the disc space appeared to be well decompressed. This was judged by passing a large ball tip nerve hook into this region. With this the microscope was removed and hemostasis and the soft tissues obtained meticulously and the lumbar dorsal fascia was then closed with #1 and 20 Vicryls in interrupted fashion. 20 mL of half percent Marcaine was injected into the paraspinous fascia. The subcutaneous tissue was closed with 20 Vicryls and 30 Vicryls used to close the subcutaneous take her skin. Dermabond was placed on the skin. Blood loss was estimated at about 50 mL.

## 2015-04-03 NOTE — Anesthesia Preprocedure Evaluation (Addendum)
Anesthesia Evaluation  Patient identified by MRN, date of birth, ID band Patient awake    Reviewed: Allergy & Precautions, NPO status , Patient's Chart, lab work & pertinent test results  History of Anesthesia Complications Negative for: history of anesthetic complications  Airway Mallampati: II  TM Distance: >3 FB Neck ROM: Full    Dental no notable dental hx. (+) Teeth Intact   Pulmonary neg pulmonary ROS,  breath sounds clear to auscultation  Pulmonary exam normal       Cardiovascular hypertension, Normal cardiovascular examRhythm:Regular Rate:Normal     Neuro/Psych  Neuromuscular disease    GI/Hepatic negative GI ROS, Neg liver ROS,   Endo/Other  negative endocrine ROS  Renal/GU negative Renal ROS     Musculoskeletal Lumber disc   Abdominal (+) + obese,   Peds  Hematology   Anesthesia Other Findings   Reproductive/Obstetrics                            Anesthesia Physical Anesthesia Plan  ASA: II  Anesthesia Plan: General   Post-op Pain Management:    Induction: Intravenous  Airway Management Planned: Oral ETT  Additional Equipment:   Intra-op Plan:   Post-operative Plan: Extubation in OR  Informed Consent: I have reviewed the patients History and Physical, chart, labs and discussed the procedure including the risks, benefits and alternatives for the proposed anesthesia with the patient or authorized representative who has indicated his/her understanding and acceptance.   Dental advisory given  Plan Discussed with: CRNA and Surgeon  Anesthesia Plan Comments:         Anesthesia Quick Evaluation

## 2015-04-03 NOTE — Progress Notes (Signed)
Dr. Okey Dupre notified of BP still up in the 190's, did not take his BP meds this am. OK to take his BP meds. Once back in his room. Patient denies any pain nor discomfort as of this time.

## 2015-04-03 NOTE — Anesthesia Procedure Notes (Signed)
Procedure Name: Intubation Date/Time: 04/03/2015 2:58 PM Performed by: Leonel Ramsay Pre-anesthesia Checklist: Patient identified, Timeout performed, Emergency Drugs available, Suction available and Patient being monitored Patient Re-evaluated:Patient Re-evaluated prior to inductionOxygen Delivery Method: Circle system utilized Preoxygenation: Pre-oxygenation with 100% oxygen Intubation Type: IV induction Ventilation: Mask ventilation without difficulty and Oral airway inserted - appropriate to patient size Laryngoscope Size: Mac and 4 Grade View: Grade I Tube type: Oral Tube size: 7.5 mm Number of attempts: 1 Airway Equipment and Method: Stylet and Oral airway Placement Confirmation: ETT inserted through vocal cords under direct vision,  positive ETCO2 and breath sounds checked- equal and bilateral Secured at: 22 cm Tube secured with: Tape Dental Injury: Teeth and Oropharynx as per pre-operative assessment

## 2015-04-03 NOTE — Transfer of Care (Signed)
Immediate Anesthesia Transfer of Care Note  Patient: Dean Andersen  Procedure(s) Performed: Procedure(s) with comments: Microdiscectomy at the level of Lumbar four- five (Right) - Right L4-5 Diskectomy  Patient Location: PACU  Anesthesia Type:General  Level of Consciousness: awake, alert  and oriented  Airway & Oxygen Therapy: Patient Spontanous Breathing and Patient connected to nasal cannula oxygen  Post-op Assessment: Report given to RN and Post -op Vital signs reviewed and stable  Post vital signs: Reviewed and stable  Last Vitals:  Filed Vitals:   04/03/15 1613  BP: 194/90  Pulse: 65  Temp: 36.8 C  Resp: 12    Complications: No apparent anesthesia complications

## 2015-04-04 LAB — GLUCOSE, CAPILLARY: GLUCOSE-CAPILLARY: 120 mg/dL — AB (ref 65–99)

## 2015-04-04 MED ORDER — OXYCODONE-ACETAMINOPHEN 5-325 MG PO TABS: 1.0000 | ORAL_TABLET | ORAL | Status: AC | PRN

## 2015-04-04 MED ORDER — METHYLPREDNISOLONE 4 MG PO TBPK: ORAL_TABLET | ORAL | Status: AC

## 2015-04-04 NOTE — Evaluation (Signed)
Occupational Therapy Evaluation Patient Details Name: Dean Andersen MRN: [id number] DOB: 01-03-1941 Today's Date: 04/04/2015    History of Present Illness 74 y.o. male s/p Microdiscectomy L4-5 right.   Clinical Impression   Pt is at mod A level with LB ADLs and sup with ADL mobility. Pt will have 24 hours assist at home and plans to d/c today. Pt and his wife provided with education and demo of ADL A/E, tub bench and 3 in 1 for hoe use. All education completed and no further acute OT indicated at this time    Follow Up Recommendations  No OT follow up;Supervision/Assistance - 24 hour    Equipment Recommendations  Tub/shower bench;3 in 1 bedside comode;Other (comment) (ADL A/E)    Recommendations for Other Services       Precautions / Restrictions Precautions Precautions: Back Precaution Booklet Issued: Yes (comment) Precaution Comments: pt able to recall 1/3 back precautions, reviewd with pt and wife Restrictions Weight Bearing Restrictions: No      Mobility Bed Mobility Overal bed mobility: Needs Assistance Bed Mobility: Rolling;Sidelying to Sit;Sit to Sidelying Rolling: Supervision Sidelying to sit: Supervision     Sit to sidelying: Supervision General bed mobility comments: pt up in recliner  Transfers Overall transfer level: Needs assistance Equipment used: Rolling walker (2 wheeled) Transfers: Sit to/from Stand Sit to Stand: Supervision         General transfer comment: Supervision for safety. VC for hand placement. Performed from various surfaces. Steady upon rising and holding RW for support.    Balance Overall balance assessment: Needs assistance Sitting-balance support: No upper extremity supported;Feet supported Sitting balance-Leahy Scale: Good     Standing balance support: Single extremity supported;During functional activity Standing balance-Leahy Scale: Poor                              ADL Overall ADL's : Needs  assistance/impaired     Grooming: Wash/dry hands;Wash/dry face;Standing;Min guard   Upper Body Bathing: Supervision/ safety;Set up;Sitting   Lower Body Bathing: Moderate assistance   Upper Body Dressing : Set up;Supervision/safety;Sitting   Lower Body Dressing: Moderate assistance   Toilet Transfer: RW;Grab bars;Comfort height toilet;Supervision/safety;Cueing for safety   Toileting- Clothing Manipulation and Hygiene: Minimal assistance;Sit to/from Nurse, children's Details (indicate cue type and reason): pt has tub shower at home and states that he will take sponge baths for awhile Functional mobility during ADLs: Cueing for safety;Supervision/safety General ADL Comments: pt and his wife eduacted on ADL A/E, 3 in 1 and tub bench for home use     Vision  no change from baseline   Perception Perception Perception Tested?: No   Praxis Praxis Praxis tested?: Not tested    Pertinent Vitals/Pain Pain Assessment: 0-10 Pain Score: 6  Pain Location: back Pain Descriptors / Indicators: Aching;Dull Pain Intervention(s): Monitored during session;Repositioned     Hand Dominance Right   Extremity/Trunk Assessment Upper Extremity Assessment Upper Extremity Assessment: Overall WFL for tasks assessed   Lower Extremity Assessment Lower Extremity Assessment: Defer to PT evaluation   Cervical / Trunk Assessment Cervical / Trunk Assessment: Normal   Communication Communication Communication: HOH   Cognition Arousal/Alertness: Awake/alert Behavior During Therapy: WFL for tasks assessed/performed Overall Cognitive Status: Within Functional Limits for tasks assessed                     General Comments   pt/family pleasant and cooperative  Home Living Family/patient expects to be discharged to:: Private residence Living Arrangements: Spouse/significant other Available Help at Discharge: Family;Available 24 hours/day Type of Home:  House Home Access: Stairs to enter [employer] of Steps: 1 Entrance Stairs-Rails: None Home Layout: Two level;Able to live on main level with bedroom/bathroom     Bathroom Shower/Tub: Chief Strategy Officer: Standard     Home Equipment: Environmental consultant - 2 wheels;Cane - quad          Prior Functioning/Environment Level of Independence: Independent with assistive device(s)        Comments: started using rollator about 1 week ago due to pain. Independent prior to this.    OT Diagnosis: Acute pain   OT Problem List: Decreased knowledge of use of DME or AE;Impaired balance (sitting and/or standing);Pain;Decreased activity tolerance   OT Treatment/Interventions:      OT Goals(Current goals can be found in the care plan section) Acute Rehab OT Goals Patient Stated Goal: Get well again OT Goal Formulation: With patient/family Time For Goal Achievement: 04/04/15 Potential to Achieve Goals: Good  OT Frequency:     Barriers to D/C:  none                        End of Session Equipment Utilized During Treatment: Rolling walker;Other (comment) (3 in 1)  Activity Tolerance: Patient tolerated treatment well Patient left: in chair;with family/visitor present   Time: 1025-1046 OT Time Calculation (min): 21 min Charges:  OT General Charges $OT Visit: 1 Procedure OT Evaluation $Initial OT Evaluation Tier I: 1 Procedure OT Treatments $Therapeutic Activity: 8-22 mins G-Codes:    Galen Manila 04/04/2015, 11:46 AM

## 2015-04-04 NOTE — Discharge Summary (Signed)
Physician Discharge Summary  Patient ID: Dean Andersen MRN: [id number] DOB/AGE: [birth year] 74 y.o.  Admit date: 03/31/2015 Discharge date: 04/04/2015  Admission Diagnoses: HNP, right L4-5  Discharge Diagnoses: Same Active Problems:   Lumbar radiculopathy   Herniated nucleus pulposus, L4-5 right   Discharged Condition: Stable  Hospital Course:  Dean Andersen is a 74 y.o. male admitted in transfer with severe back and right leg pain. He was initially placed on pain medication and steroids, and subsequently underwent microdiscectomy. Postop he was reporting some improvement in his leg pain, ambulating well with PT with a rolling walker, was tolerating diet and voiding normally.  Treatments: Surgery - Right L4-5 microdiscectomy  Discharge Exam: Blood pressure 129/52, pulse 56, temperature 97.9 F (36.6 C), temperature source Oral, resp. rate 16, height 5\' 9"  (1.753 m), weight 111 kg (244 lb 11.4 oz), SpO2 96 %. Awake, alert, oriented Speech fluent, appropriate CN grossly intact 5/5 BUE/BLE Wound c/d/i  Disposition: Home     Medication List    STOP taking these medications        predniSONE 5 MG tablet  Commonly known as:  DELTASONE      TAKE these medications        amLODipine 5 MG tablet  Commonly known as:  NORVASC  Take 5 mg by mouth daily.     diazepam 10 MG tablet  Commonly known as:  VALIUM  Take 10 mg by mouth 2 (two) times daily as needed for anxiety.     docusate sodium 100 MG capsule  Commonly known as:  COLACE  Take 100 mg by mouth 2 (two) times daily.     fenofibrate 160 MG tablet  Take 160 mg by mouth daily.     fluticasone 50 MCG/ACT nasal spray  Commonly known as:  FLONASE  Place 1-2 sprays into both nostrils daily.     hydrochlorothiazide 12.5 MG tablet  Commonly known as:  HYDRODIURIL  Take 12.5 mg by mouth every morning.     HYDROcodone-acetaminophen 5-325 MG per tablet  Commonly known as:  NORCO/VICODIN  Take 1 tablet by mouth  every 6 (six) hours as needed. for pain     hydroxychloroquine 200 MG tablet  Commonly known as:  PLAQUENIL  Take 200 mg by mouth 2 (two) times daily.     hydrOXYzine 50 MG capsule  Commonly known as:  VISTARIL  Take 50 mg by mouth 3 (three) times daily as needed for itching.     ibuprofen 600 MG tablet  Commonly known as:  ADVIL,MOTRIN  Take 600 mg by mouth every 6 (six) hours as needed for moderate pain.     meloxicam 7.5 MG tablet  Commonly known as:  MOBIC  Take 7.5 mg by mouth daily.     methylPREDNISolone 4 MG Tbpk tablet  Commonly known as:  MEDROL DOSEPAK  Take tapering dose as directed on package     metoCLOPramide 10 MG tablet  Commonly known as:  REGLAN  Take 10 mg by mouth 4 (four) times daily -  before meals and at bedtime.     metoprolol succinate 50 MG 24 hr tablet  Commonly known as:  TOPROL-XL  Take 50 mg by mouth every morning.     omeprazole 20 MG capsule  Commonly known as:  PRILOSEC  Take 20 mg by mouth 2 (two) times daily.     oxyCODONE-acetaminophen 5-325 MG per tablet  Commonly known as:  PERCOCET/ROXICET  Take 1-2 tablets by mouth every 4 (four)  hours as needed for moderate pain.     tiZANidine 4 MG tablet  Commonly known as:  ZANAFLEX  Take 4 mg by mouth 3 (three) times daily.     traMADol 50 MG tablet  Commonly known as:  ULTRAM  Take 50 mg by mouth every 4 (four) hours as needed for moderate pain.     WELCHOL 625 MG tablet  Generic drug:  colesevelam  Take 1,875 mg by mouth 2 (two) times daily.           Follow-up Information    Follow up with Stefani Dama, MD In 2 weeks.   Specialty:  Neurosurgery   Contact information:   [address] [place] [address] [phone]       Signed: Lisbeth Renshaw, Salena Saner 04/04/2015, 10:26 AM

## 2015-04-04 NOTE — Progress Notes (Signed)
CM spoke with patient, wife and family in room.  Patient elected [facility] for DME, Rolling walker, Tub Bench and 3in1.  Fayrene Fearing of  [employer] called at [phone].  Patient is from [place], [place],  CM telephone [facility] at Tel: [phone]   Fax [phone] and left voice mail message to arrange for DME.  CM called patient and wife at [phone] and provided number of Home health to call for Home health PT services.  CM encouraged patient and wife to call [facility] , CM department for further questions or need regarding their home health.

## 2015-04-04 NOTE — Progress Notes (Signed)
Discharge orders received. Pt and wife educated on d/c instructions. Pt verbalized understanding. IVs removed. Pt given d/c packet and prescriptions. Home equipment delivered to pt's room. Pt dressed and belongings packed. Pt taken downstairs by staff via wheelchair.

## 2015-04-04 NOTE — Care Management Note (Signed)
Case Management Note  Patient Details  Name: Dean Andersen MRN: [id number] Date of Birth: [birth year]  Subjective/Objective:         s/p Microdiscectomy L4-5 right.           Action/Plan: D/c home with home health  Expected Discharge Date:                  Expected Discharge Plan:  Home w Home Health Services (Patient lives at home with spouse.)  In-House Referral:     Discharge planning Services  CM Consult  Post Acute Care Choice:    Choice offered to:  Patient, Adult Children, Spouse  DME Arranged:  3-N-1, Walker rolling, Tub bench DME Agency:     HH Arranged:  PT [facility] at [phone],   Fax [phone] for PT) [facility] Agency:     Status of Service:  In process, will continue to follow  Medicare Important Message Given:  Yes Date Medicare IM Given:  04/03/15 Medicare IM give by:  Elmer Bales RN, MSN, CM Date Additional Medicare IM Given:    Additional Medicare Important Message give by:     If discussed at Long Length of Stay Meetings, dates discussed:    Additional Comments:  Governor Specking, RN 04/04/2015, 6:09 PM

## 2015-04-04 NOTE — Evaluation (Signed)
Physical Therapy Evaluation and Discharge Patient Details Name: Dean Andersen MRN: [id number] DOB: Feb 05, 1941 Today's Date: 04/04/2015   History of Present Illness  74 y.o. male s/p Microdiscectomy L4-5 right.  Clinical Impression  Patient evaluated by Physical Therapy with no further acute PT needs identified. All education has been completed and the patient has no further questions. Demonstrates notable functional weakness with eccentric contraction of Rt  quadriceps while descending stairs. Ambulates quite well without loss of balance while using a rolling walker. Recommend HHPT visit patient to progress this functional weakness so patient can safely navigate stairs without an assistive device in the near future. See below for any follow-up Physial Therapy or equipment needs. PT is signing off. Thank you for this referral.     Follow Up Recommendations Home health PT;Supervision for mobility/OOB    Equipment Recommendations  Rolling walker with 5" wheels    Recommendations for Other Services       Precautions / Restrictions Precautions Precautions: Back Precaution Booklet Issued: Yes (comment) Precaution Comments: reviewed with patient and wife Restrictions Weight Bearing Restrictions: No      Mobility  Bed Mobility Overal bed mobility: Needs Assistance Bed Mobility: Rolling;Sidelying to Sit;Sit to Sidelying Rolling: Supervision Sidelying to sit: Supervision     Sit to sidelying: Supervision General bed mobility comments: Supervision for safety. Educated on log roll technique. No physical assist required to perform safely.  Transfers Overall transfer level: Needs assistance Equipment used: Rolling walker (2 wheeled) Transfers: Sit to/from Stand Sit to Stand: Supervision         General transfer comment: Supervision for safety. VC for hand placement. Performed from various surfaces. Steady upon rising and holding RW for support.  Ambulation/Gait Ambulation/Gait  assistance: Supervision Ambulation Distance (Feet): 300 Feet Assistive device: Rolling walker (2 wheeled) Gait Pattern/deviations: Step-through pattern;Decreased stride length Gait velocity: decreased   General Gait Details: Ambulates generally well although slowly. No buckling noted. VC for forward gaze intermittently. States LEs feel much better compared to prior to surgery. No loss of balance while using RW. Cues for walker placement initially. Able to take steps backwards safely.  Stairs Stairs: Yes Stairs assistance: Min assist Stair Management: One rail Right;Step to pattern;Backwards;Forwards;With walker Number of Stairs: 2 (x3) General stair comments: Practiced with single rail to mimic door frame pt typically uses at home. VC for sequencing and technique. Pt safely ascends with good control. RLE with significant functional weakness during eccentric contraction during descent. Very weak after descending requiring seated rest break to recover. Trialed ascend/descending steps with RW for BIL UE support and patient demonstrated safe ability to complete this task using RW and backwards approach. States he feels confident with this task. Wife educated on need for RW to enter/exit home safely.  Wheelchair Mobility    Modified Rankin (Stroke Patients Only)       Balance Overall balance assessment: Needs assistance Sitting-balance support: No upper extremity supported;Feet supported Sitting balance-Leahy Scale: Good     Standing balance support: Single extremity supported Standing balance-Leahy Scale: Poor                               Pertinent Vitals/Pain Pain Assessment: 0-10 Pain Score: 7  Pain Location: back Pain Descriptors / Indicators: Dull Pain Intervention(s): Monitored during session;Repositioned    Home Living Family/patient expects to be discharged to:: Private residence Living Arrangements: Spouse/significant other Available Help at Discharge:  Family;Available 24 hours/day Type of Home: House  Home Access: Stairs to enter Entrance Stairs-Rails: None Entrance Stairs-Number of Steps: 1 Home Layout: Two level;Able to live on main level with bedroom/bathroom Home Equipment: Gilford Rile - 2 wheels;Cane - quad      Prior Function Level of Independence: Independent with assistive device(s)         Comments: started using rollator about 1 week ago due to pain. Independent prior to this.     Hand Dominance   Dominant Hand: Right    Extremity/Trunk Assessment   Upper Extremity Assessment: Defer to OT evaluation           Lower Extremity Assessment: Generalized weakness         Communication   Communication: HOH  Cognition Arousal/Alertness: Awake/alert Behavior During Therapy: WFL for tasks assessed/performed Overall Cognitive Status: Within Functional Limits for tasks assessed                      General Comments General comments (skin integrity, edema, etc.): Wife present and very supportive. Educated both pt and wife on safe mobility techniques.    Exercises General Exercises - Lower Extremity Ankle Circles/Pumps: AROM;Both;10 reps;Supine Gluteal Sets: Strengthening;Both;10 reps;Seated Long Arc Quad: Strengthening;Right;10 reps;Seated      Assessment/Plan    PT Assessment All further PT needs can be met in the next venue of care  PT Diagnosis Abnormality of gait;Acute pain   PT Problem List Decreased strength;Decreased activity tolerance;Decreased balance;Decreased mobility;Decreased range of motion;Pain  PT Treatment Interventions     PT Goals (Current goals can be found in the Care Plan section) Acute Rehab PT Goals Patient Stated Goal: Get well again PT Goal Formulation: All assessment and education complete, DC therapy    Frequency     Barriers to discharge        Co-evaluation               End of Session Equipment Utilized During Treatment: Gait belt Activity Tolerance:  Patient tolerated treatment well Patient left: in chair;with call bell/phone within reach;with chair alarm set;with family/visitor present Nurse Communication: Mobility status         Time: 0929-1010 PT Time Calculation (min) (ACUTE ONLY): 41 min   Charges:   PT Evaluation $Initial PT Evaluation Tier I: 1 Procedure PT Treatments $Gait Training: 8-22 mins $Self Care/Home Management: 8-22   PT G Codes:        Ellouise Newer 04/04/2015, 11:25 AM  Elayne Snare, [place]

## 2015-04-06 ENCOUNTER — Encounter (HOSPITAL_COMMUNITY): Payer: Self-pay | Admitting: Neurological Surgery

## 2015-04-06 NOTE — Progress Notes (Signed)
Received a call from patient's husband stating that she had been instructed to call [facility] after discharge to set up HHPT for patient.  Patient's wife states that [facility] asked that she call [facility] to get a referral.  CM spoke with Kathie Rhodes at [facility] and faxed the requested information.

## 2015-04-06 NOTE — Anesthesia Postprocedure Evaluation (Signed)
  Anesthesia Post-op Note  Patient: Dean Andersen  Procedure(s) Performed: Procedure(s) (LRB): Microdiscectomy at the level of Lumbar four- five (Right)  Patient Location: PACU  Anesthesia Type: General  Level of Consciousness: awake and alert   Airway and Oxygen Therapy: Patient Spontanous Breathing  Post-op Pain: mild  Post-op Assessment: Post-op Vital signs reviewed, Patient's Cardiovascular Status Stable, Respiratory Function Stable, Patent Airway and No signs of Nausea or vomiting  Last Vitals:  Filed Vitals:   04/04/15 1422  BP: 146/55  Pulse: 60  Temp: 36.7 C  Resp: 16    Post-op Vital Signs: stable   Complications: No apparent anesthesia complications

## 2015-04-07 NOTE — Progress Notes (Signed)
Received a call from [facility] at [phone] stating that they are unable to provide staffing for the patient, although they had accepted the referral on 04/06/15.  CM has contacted and faxed clinicals [facility] [phone], per wife's request.  CM is awaiting word on insurance authorization.  Patient's wife was updated and is appreciative of the assistance provided by CM.

## 2015-04-08 NOTE — Progress Notes (Signed)
CM received a call from [facility] stating that they are able to accept the patient and will be setting up an appointment to see him today.  CM spoke with patient's wife, who stated that she had already been contacted and the company had just arrived to do the intake assessment.

## 2016-01-15 IMAGING — DX DG LUMBAR SPINE 2-3V
1 series · 1 of 1 positions shown · non-contrast
Comparison: MRI of the lumbar spine 04/02/2015.

CLINICAL DATA: 73-year-old male status post right L4-L5 diskectomy
for lumbar herniated disc and associated radiculopathy.

EXAM:
LUMBAR SPINE - 2-3 VIEW

[lat]
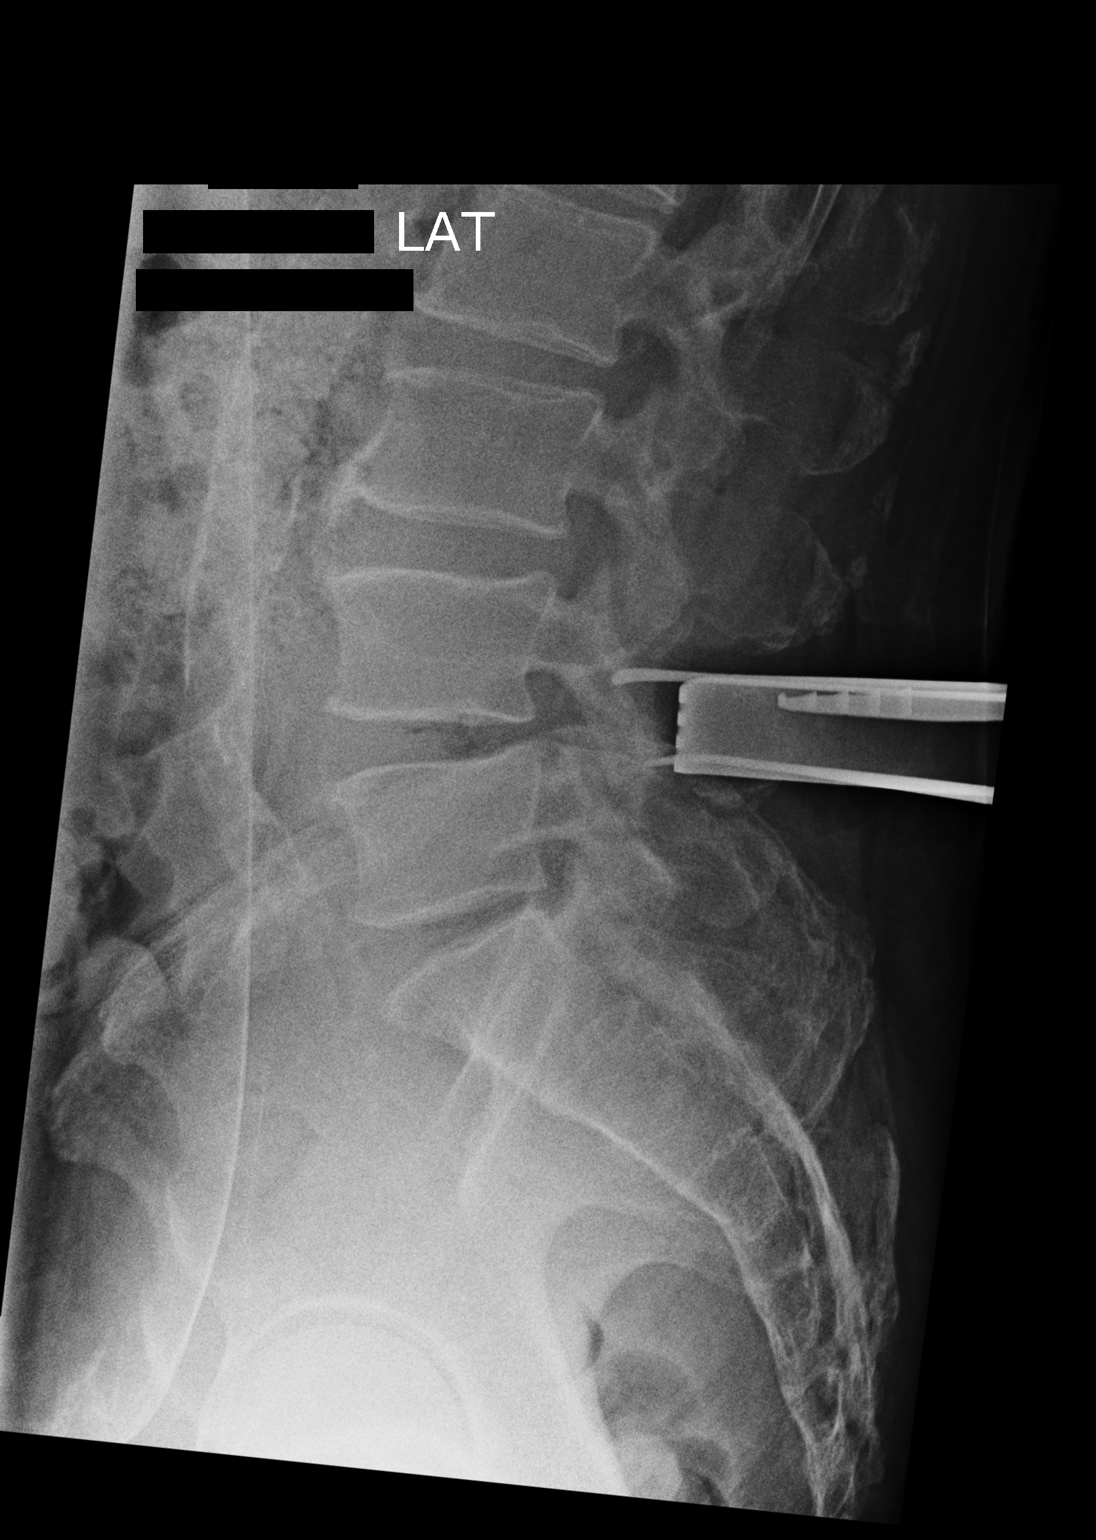

[1 of 1 positions shown; findings below may reference images not displayed]

FINDINGS: Image number 1 demonstrates skin retractors in place posterior to
L4-L5, with a localization needle posterior to the L4-L5 interspace.
Image number 2 demonstrates additional soft tissue retractors and
surgical probes posterior to L4-L5, with the tip of 1 probe
immediately posterior to the inferior aspect of the pedicle of L4,
and the lower probe immediately posterior to the superior aspect of
the L5 pedicle.
IMPRESSION: 1. Intraoperative localization, as above.
